# Patient Record
Sex: Female | Born: 1975 | Race: Black or African American | Hispanic: No | Marital: Single | Smoking: Never smoker
Health system: Southern US, Community
[De-identification: ages and names within clinical notes are randomized; demographics above are authoritative.]

## PROBLEM LIST (undated history)

## (undated) HISTORY — PX: LAPAROSCOPIC GASTRIC SLEEVE RESECTION: SHX5895

## (undated) HISTORY — PX: ABDOMINAL SURGERY: SHX537

## (undated) HISTORY — PX: OTHER SURGICAL HISTORY: SHX169

---

## 2009-12-03 ENCOUNTER — Emergency Department (HOSPITAL_BASED_OUTPATIENT_CLINIC_OR_DEPARTMENT_OTHER): Admission: EM | Admit: 2009-12-03 | Discharge: 2009-12-03 | Payer: Self-pay | Admitting: Emergency Medicine

## 2020-08-27 ENCOUNTER — Other Ambulatory Visit: Payer: Self-pay

## 2020-08-27 ENCOUNTER — Emergency Department (HOSPITAL_BASED_OUTPATIENT_CLINIC_OR_DEPARTMENT_OTHER)
Admission: EM | Admit: 2020-08-27 | Discharge: 2020-08-27 | Disposition: A | Discharge status: Home / Self Care | Payer: Medicaid Other | Attending: Emergency Medicine | Admitting: Emergency Medicine

## 2020-08-27 ENCOUNTER — Encounter (HOSPITAL_BASED_OUTPATIENT_CLINIC_OR_DEPARTMENT_OTHER): Payer: Self-pay | Admitting: Emergency Medicine

## 2020-08-27 DIAGNOSIS — U071 COVID-19: Secondary | ICD-10-CM | POA: Diagnosis not present

## 2020-08-27 DIAGNOSIS — R42 Dizziness and giddiness: Secondary | ICD-10-CM | POA: Diagnosis present

## 2020-08-27 LAB — SARS CORONAVIRUS 2 (TAT 6-24 HRS): SARS Coronavirus 2: POSITIVE — AB

## 2020-08-27 MED ORDER — MECLIZINE HCL 25 MG PO TABS: 25.0000 mg | ORAL_TABLET | Freq: Three times a day (TID) | ORAL | 0 refills | Status: DC | PRN

## 2020-08-27 NOTE — ED Provider Notes (Signed)
[facility] EMERGENCY DEPARTMENT Provider Note   CSN: [id number] Arrival date & time: 08/27/20  1601     History Chief Complaint  Patient presents with  . Dizziness    Ariel Flores is a 45 y.o. female w/ hx of vertigo presenting to Ed with vertigo. She reports she woke up feeling dizzy yesterday, room spinning.  This was worse with movement.  Better at rest.  Sx lingered all day, then this morning she woke up again feeling vertigo again.  No nausea, vomiting, headache, numbness or weakness of extremities.  No hx of TIA or stroke.  No hx of smoking or drug use.  She is not vaccinated for covid 19.  No hx of cough, congestion, fever, sore throat, or headache.  No dysuria, hematuria, or UTI symptoms.  No ear pain, loss of hearing, ear drainage, or change in hearing.  Hx of vertigo in the past, at random, lasting few days then resolving.  HPI     History reviewed. No pertinent past medical history.  There are no problems to display for this patient.   Past Surgical History:  Procedure Laterality Date  . LAPAROSCOPIC GASTRIC SLEEVE RESECTION       OB History   No obstetric history on file.     No family history on file.  Social History   Tobacco Use  . Smoking status: Never Smoker  . Smokeless tobacco: Never Used  Substance Use Topics  . Alcohol use: Yes    Comment: occasionally  . Drug use: Never    Home Medications Prior to Admission medications   Medication Sig Start Date End Date Taking? Authorizing Provider  meclizine (ANTIVERT) 25 MG tablet Take 1 tablet (25 mg total) by mouth 3 (three) times daily as needed for up to 30 doses for dizziness. 08/27/20  Yes Ricardo Schubach, Kermit Balo, MD    Allergies    Patient has no allergy information on record.  Review of Systems   Review of Systems  Constitutional: Negative for chills and fever.  HENT: Negative for ear discharge, ear pain and hearing loss.   Eyes: Negative for pain and visual disturbance.   Respiratory: Negative for cough and shortness of breath.   Cardiovascular: Negative for chest pain and palpitations.  Gastrointestinal: Negative for abdominal pain, nausea and vomiting.  Genitourinary: Negative for difficulty urinating and dysuria.  Musculoskeletal: Negative for arthralgias and myalgias.  Skin: Negative for color change and rash.  Neurological: Positive for dizziness. Negative for seizures, syncope, facial asymmetry, speech difficulty, weakness, numbness and headaches.  All other systems reviewed and are negative.   Physical Exam Updated Vital Signs BP 109/63 (BP Location: Right Arm)   Pulse 68   Temp 98.1 F (36.7 C) (Oral)   Resp 18   Ht 5\' 7"  (1.702 m)   Wt 69.9 kg   SpO2 100%   BMI 24.12 kg/m   Physical Exam Constitutional:      General: She is not in acute distress. HENT:     Head: Normocephalic and atraumatic.  Eyes:     General: No visual field deficit.    Conjunctiva/sclera: Conjunctivae normal.     Pupils: Pupils are equal, round, and reactive to light.  Cardiovascular:     Rate and Rhythm: Normal rate and regular rhythm.  Pulmonary:     Effort: Pulmonary effort is normal. No respiratory distress.  Abdominal:     General: There is no distension.     Tenderness: There is no abdominal tenderness.  Skin:    General: Skin is warm and dry.  Neurological:     General: No focal deficit present.     Mental Status: She is alert. Mental status is at baseline.     GCS: GCS eye subscore is 4. GCS verbal subscore is 5. GCS motor subscore is 6.     Cranial Nerves: Cranial nerves are intact. No cranial nerve deficit, dysarthria or facial asymmetry.     Sensory: Sensation is intact.     Motor: Motor function is intact.     Coordination: Coordination is intact.     Comments: Head impulse shows corrective saccades Nystagmus is present, unilateral with rightward gaze Test of skew - no skew   Psychiatric:        Mood and Affect: Mood normal.         Behavior: Behavior normal.     ED Results / Procedures / Treatments   Labs (all labs ordered are listed, but only abnormal results are displayed) Labs Reviewed  SARS CORONAVIRUS 2 (TAT 6-24 HRS) - Abnormal; Notable for the following components:      Result Value   SARS Coronavirus 2 POSITIVE (*)    All other components within normal limits    EKG None  Radiology No results found.  Procedures Procedures (including critical care time)  Medications Ordered in ED Medications - No data to display  ED Course  I have reviewed the triage vital signs and the nursing notes.  Pertinent labs & imaging results that were available during my care of the patient were reviewed by me and considered in my medical decision making (see chart for details).  45 yo female here with vertigo, mild symptoms HINTS exam suggestive of peripheral cause of vertigo No acute stroke risk factors  PCP regularly checks her bloodwork.  Careeverywhere shows blood tests in Aug 2021, normal BMP and Mg and b12 level.  Normal hgb in 09/13/19 - no hx of significant anemia.   Doubt stroke, anemia, ACS at this time   We discussed covid testing as well, will send one out Meclizine prescribed for vertigo sx at home  Okay to discharge  Final Clinical Impression(s) / ED Diagnoses Final diagnoses:  Vertigo    Rx / DC Orders ED Discharge Orders         Ordered    meclizine (ANTIVERT) 25 MG tablet  3 times daily PRN        08/27/20 0820           Terald Sleeper, MD 08/27/20 1707

## 2020-08-27 NOTE — ED Triage Notes (Signed)
Reports waking up dizzy the last two morning.  Denies any other complaints.

## 2021-01-26 ENCOUNTER — Other Ambulatory Visit: Payer: Self-pay

## 2021-01-26 ENCOUNTER — Encounter (HOSPITAL_BASED_OUTPATIENT_CLINIC_OR_DEPARTMENT_OTHER): Payer: Self-pay | Admitting: Urology

## 2021-01-26 ENCOUNTER — Emergency Department (HOSPITAL_BASED_OUTPATIENT_CLINIC_OR_DEPARTMENT_OTHER)
Admission: EM | Admit: 2021-01-26 | Discharge: 2021-01-26 | Disposition: A | Discharge status: Home / Self Care | Payer: Medicaid Other | Attending: Emergency Medicine | Admitting: Emergency Medicine

## 2021-01-26 DIAGNOSIS — R110 Nausea: Secondary | ICD-10-CM | POA: Insufficient documentation

## 2021-01-26 DIAGNOSIS — R1013 Epigastric pain: Secondary | ICD-10-CM | POA: Diagnosis not present

## 2021-01-26 LAB — CBC WITH DIFFERENTIAL/PLATELET
Abs Immature Granulocytes: 0.02 10*3/uL (ref 0.00–0.07)
Basophils Absolute: 0 10*3/uL (ref 0.0–0.1)
Basophils Relative: 1 %
Eosinophils Absolute: 0.2 10*3/uL (ref 0.0–0.5)
Eosinophils Relative: 3 %
HCT: 35.8 % — ABNORMAL LOW (ref 36.0–46.0)
Hemoglobin: 12.4 g/dL (ref 12.0–15.0)
Immature Granulocytes: 0 %
Lymphocytes Relative: 13 %
Lymphs Abs: 0.7 10*3/uL (ref 0.7–4.0)
MCH: 31.7 pg (ref 26.0–34.0)
MCHC: 34.6 g/dL (ref 30.0–36.0)
MCV: 91.6 fL (ref 80.0–100.0)
Monocytes Absolute: 0.4 10*3/uL (ref 0.1–1.0)
Monocytes Relative: 7 %
Neutro Abs: 4.2 10*3/uL (ref 1.7–7.7)
Neutrophils Relative %: 76 %
Platelets: 150 10*3/uL (ref 150–400)
RBC: 3.91 MIL/uL (ref 3.87–5.11)
RDW: 12.4 % (ref 11.5–15.5)
WBC: 5.5 10*3/uL (ref 4.0–10.5)
nRBC: 0 % (ref 0.0–0.2)

## 2021-01-26 LAB — COMPREHENSIVE METABOLIC PANEL
ALT: 19 U/L (ref 0–44)
AST: 25 U/L (ref 15–41)
Albumin: 3.7 g/dL (ref 3.5–5.0)
Alkaline Phosphatase: 55 U/L (ref 38–126)
Anion gap: 6 (ref 5–15)
BUN: 11 mg/dL (ref 6–20)
CO2: 27 mmol/L (ref 22–32)
Calcium: 9.2 mg/dL (ref 8.9–10.3)
Chloride: 107 mmol/L (ref 98–111)
Creatinine, Ser: 0.83 mg/dL (ref 0.44–1.00)
GFR, Estimated: >60 mL/min (ref >60)
Glucose, Bld: 98 mg/dL (ref 70–99)
Potassium: 3.8 mmol/L (ref 3.5–5.1)
Sodium: 140 mmol/L (ref 135–145)
Total Bilirubin: 0.4 mg/dL (ref 0.3–1.2)
Total Protein: 6.3 g/dL — ABNORMAL LOW (ref 6.5–8.1)

## 2021-01-26 LAB — URINALYSIS, MICROSCOPIC (REFLEX)

## 2021-01-26 LAB — URINALYSIS, ROUTINE W REFLEX MICROSCOPIC
Bilirubin Urine: NEGATIVE
Glucose, UA: NEGATIVE mg/dL
Ketones, ur: NEGATIVE mg/dL
Leukocytes,Ua: NEGATIVE
Nitrite: NEGATIVE
Protein, ur: NEGATIVE mg/dL
Specific Gravity, Urine: 1.02 (ref 1.005–1.030)
pH: 5.5 (ref 5.0–8.0)

## 2021-01-26 LAB — PREGNANCY, URINE: Preg Test, Ur: NEGATIVE

## 2021-01-26 LAB — LIPASE, BLOOD: Lipase: 37 U/L (ref 11–51)

## 2021-01-26 MED ORDER — DICYCLOMINE HCL 10 MG PO CAPS
10.0000 mg | ORAL_CAPSULE | Freq: Once | ORAL | Status: AC
  Administered 2021-01-26: 10 mg via ORAL
  Filled 2021-01-26: qty 1

## 2021-01-26 MED ORDER — ONDANSETRON HCL 4 MG/2ML IJ SOLN
4.0000 mg | INTRAMUSCULAR | Status: AC
  Administered 2021-01-26: 4 mg via INTRAVENOUS
  Filled 2021-01-26: qty 2

## 2021-01-26 MED ORDER — ALUM & MAG HYDROXIDE-SIMETH 200-200-20 MG/5ML PO SUSP
15.0000 mL | Freq: Once | ORAL | Status: AC
  Administered 2021-01-26: 15 mL via ORAL
  Filled 2021-01-26: qty 30

## 2021-01-26 NOTE — ED Triage Notes (Signed)
Generalized abdominal pain that started last night, states nausea but denies any vomiting or diarrha

## 2021-01-26 NOTE — ED Notes (Signed)
Pt tolerating po challenge.

## 2021-01-26 NOTE — ED Notes (Signed)
ED Provider at bedside. 

## 2021-01-26 NOTE — ED Provider Notes (Signed)
[facility] EMERGENCY DEPARTMENT Provider Note   CSN: [id number] Arrival date & time: 01/26/21  4401     History Chief Complaint  Patient presents with   Abdominal Pain    Aneli Zara is a 45 y.o. female.  45 year old female with history of gastric sleeve surgery who presents with abdominal pain.  Yesterday evening, patient ate shrimp for dinner and afterwards began having epigastric pain that has been relatively constant yesterday evening and today.  She has had mild nausea but no vomiting.  She had a bowel movement earlier today, slightly loose but no severe diarrhea and no constipation problems.  She is careful about her diet since gastric sleeve surgery and denies any NSAID or alcohol use.  No fevers, urinary symptoms, or vaginal discharge.  No medications prior to arrival.  The history is provided by the patient.  Abdominal Pain     History reviewed. No pertinent past medical history.  There are no problems to display for this patient.   Past Surgical History:  Procedure Laterality Date   LAPAROSCOPIC GASTRIC SLEEVE RESECTION       OB History   No obstetric history on file.     History reviewed. No pertinent family history.  Social History   Tobacco Use   Smoking status: Never   Smokeless tobacco: Never  Substance Use Topics   Alcohol use: Not Currently    Comment: occasionally   Drug use: Never    Home Medications Prior to Admission medications   Medication Sig Start Date End Date Taking? Authorizing Provider  meclizine (ANTIVERT) 25 MG tablet Take 1 tablet (25 mg total) by mouth 3 (three) times daily as needed for up to 30 doses for dizziness. 08/27/20   Terald Sleeper, MD    Allergies    Patient has no allergy information on record.  Review of Systems   Review of Systems  Gastrointestinal:  Positive for abdominal pain.  All other systems reviewed and are negative except that which was mentioned in HPI  Physical Exam Updated Vital  Signs BP 106/69   Pulse (!) 51   Temp 97.8 F (36.6 C) (Oral)   Resp 18   Ht 5\' 7"  (1.702 m)   Wt 73.9 kg   SpO2 100%   BMI 25.53 kg/m   Physical Exam Vitals and nursing note reviewed.  Constitutional:      General: She is not in acute distress.    Appearance: Normal appearance.  HENT:     Head: Normocephalic and atraumatic.  Eyes:     Conjunctiva/sclera: Conjunctivae normal.  Cardiovascular:     Rate and Rhythm: Normal rate and regular rhythm.     Heart sounds: Normal heart sounds. No murmur heard. Pulmonary:     Effort: Pulmonary effort is normal.     Breath sounds: Normal breath sounds.  Abdominal:     General: Abdomen is flat. Bowel sounds are normal. There is no distension.     Palpations: Abdomen is soft.     Tenderness: There is abdominal tenderness in the epigastric area. There is no guarding or rebound.  Musculoskeletal:     Right lower leg: No edema.     Left lower leg: No edema.  Skin:    General: Skin is warm and dry.  Neurological:     Mental Status: She is alert and oriented to person, place, and time.     Comments: fluent  Psychiatric:        Mood and Affect: Mood  normal.        Behavior: Behavior normal.    ED Results / Procedures / Treatments   Labs (all labs ordered are listed, but only abnormal results are displayed) Labs Reviewed  COMPREHENSIVE METABOLIC PANEL - Abnormal; Notable for the following components:      Result Value   Total Protein 6.3 (*)    All other components within normal limits  CBC WITH DIFFERENTIAL/PLATELET - Abnormal; Notable for the following components:   HCT 35.8 (*)    All other components within normal limits  URINALYSIS, ROUTINE W REFLEX MICROSCOPIC - Abnormal; Notable for the following components:   Hgb urine dipstick TRACE (*)    All other components within normal limits  URINALYSIS, MICROSCOPIC (REFLEX) - Abnormal; Notable for the following components:   Bacteria, UA RARE (*)    All other components within  normal limits  LIPASE, BLOOD  PREGNANCY, URINE    EKG None  Radiology No results found.  Procedures Procedures   Medications Ordered in ED Medications  dicyclomine (BENTYL) capsule 10 mg (10 mg Oral Given 01/26/21 0924)  ondansetron (ZOFRAN) injection 4 mg (4 mg Intravenous Given 01/26/21 0933)  alum & mag hydroxide-simeth (MAALOX/MYLANTA) 200-200-20 MG/5ML suspension 15 mL (15 mLs Oral Given 01/26/21 1107)    ED Course  I have reviewed the triage vital signs and the nursing notes.  Pertinent labs  that were available during my care of the patient were reviewed by me and considered in my medical decision making (see chart for details).    MDM Rules/Calculators/A&P                          Comfortable on exam, normal vital signs, mild epigastric tenderness but no right upper quadrant tenderness and no lower abdominal pain whatsoever.  Her lab work here is reassuring with normal LFTs and lipase, normal CMP, normal UA.  After receiving above medications, patient tolerating graham crackers and apple juice on reassessment.  States she feels better.  I have discussed supportive measures at home and extensively reviewed return precautions including worsening pain, vomiting, or fever.  Patient voiced understanding. Final Clinical Impression(s) / ED Diagnoses Final diagnoses:  Epigastric pain    Rx / DC Orders ED Discharge Orders     None        Aaliah Jorgenson, Ambrose Finland, MD 01/26/21 1149

## 2021-06-21 ENCOUNTER — Emergency Department (HOSPITAL_BASED_OUTPATIENT_CLINIC_OR_DEPARTMENT_OTHER)
Admission: EM | Admit: 2021-06-21 | Discharge: 2021-06-21 | Disposition: A | Discharge status: Home / Self Care | Payer: Medicaid Other | Attending: Emergency Medicine | Admitting: Emergency Medicine

## 2021-06-21 ENCOUNTER — Encounter (HOSPITAL_BASED_OUTPATIENT_CLINIC_OR_DEPARTMENT_OTHER): Payer: Self-pay | Admitting: *Deleted

## 2021-06-21 ENCOUNTER — Other Ambulatory Visit: Payer: Self-pay

## 2021-06-21 DIAGNOSIS — H9202 Otalgia, left ear: Secondary | ICD-10-CM | POA: Insufficient documentation

## 2021-06-21 NOTE — ED Provider Notes (Signed)
[facility] EMERGENCY DEPARTMENT Provider Note   CSN: [id number] Arrival date & time: 06/21/21  1413     History Chief Complaint  Patient presents with   Otalgia    Ariel Flores is a 45 y.o. female who presents the emergency department with left ear pain x2 days.  She states that symptoms had come on gradually, and the pain feels like a throbbing.  She states that at one point the pain went away with Tylenol, but returned the following day.  She said no changes in her hearing.  No fevers, chills, pain in her jaw, dental pain, sore throat, nasal congestion or cough.   Otalgia Associated symptoms: no congestion, no cough, no ear discharge, no fever, no hearing loss, no sore throat and no tinnitus       History reviewed. No pertinent past medical history.  There are no problems to display for this patient.   Past Surgical History:  Procedure Laterality Date   CESAREAN SECTION     LAPAROSCOPIC GASTRIC SLEEVE RESECTION       OB History   No obstetric history on file.     No family history on file.  Social History   Tobacco Use   Smoking status: Never   Smokeless tobacco: Never  Substance Use Topics   Alcohol use: Not Currently    Comment: occasionally   Drug use: Never    Home Medications Prior to Admission medications   Medication Sig Start Date End Date Taking? Authorizing Provider  meclizine (ANTIVERT) 25 MG tablet Take 1 tablet (25 mg total) by mouth 3 (three) times daily as needed for up to 30 doses for dizziness. 08/27/20   Terald Sleeper, MD    Allergies    Patient has no known allergies.  Review of Systems   Review of Systems  Constitutional:  Negative for chills and fever.  HENT:  Positive for ear pain. Negative for congestion, ear discharge, hearing loss, sore throat, tinnitus and trouble swallowing.   Eyes:  Negative for pain and visual disturbance.  Respiratory:  Negative for cough and shortness of breath.   Cardiovascular:   Negative for chest pain.  All other systems reviewed and are negative.  Physical Exam Updated Vital Signs BP (!) 121/57   Pulse 69   Temp 98.1 F (36.7 C) (Oral)   Resp 16   Ht 5\' 7"  (1.702 m)   Wt 81.2 kg   SpO2 100%   BMI 28.04 kg/m   Physical Exam Vitals and nursing note reviewed.  Constitutional:      Appearance: Normal appearance.  HENT:     Head: Normocephalic and atraumatic.     Comments: No tenderness to palpation over the temporal arteries.    Right Ear: Tympanic membrane, ear canal and external ear normal.     Left Ear: Tympanic membrane normal.     Ears:     Comments: There is some tenderness to palpation over the left tragus, with no obvious lesions or erythema.  No mastoid tenderness.  Mild ear canal edema, with no obvious signs of otitis externa. Eyes:     Conjunctiva/sclera: Conjunctivae normal.  Pulmonary:     Effort: Pulmonary effort is normal. No respiratory distress.  Skin:    General: Skin is warm and dry.  Neurological:     Mental Status: She is alert.  Psychiatric:        Mood and Affect: Mood normal.        Behavior: Behavior normal.  ED Results / Procedures / Treatments   Labs (all labs ordered are listed, but only abnormal results are displayed) Labs Reviewed - No data to display  EKG None  Radiology No results found.  Procedures Procedures   Medications Ordered in ED Medications - No data to display  ED Course  I have reviewed the triage vital signs and the nursing notes.  Pertinent labs & imaging results that were available during my care of the patient were reviewed by me and considered in my medical decision making (see chart for details).    MDM Rules/Calculators/A&P                           Patient is otherwise healthy 45 year old female who presents to the emergency department for 2 days of left ear pain.  Patient states the pain feels like throbbing, but there is no associated hearing loss.  On exam there is mild  tenderness to palpation over the tragus of the left ear, no pain with manipulation of the rest of the outer ear.  No mastoid tenderness.  There is some mild edema of the left ear canal, but there is no evidence of acute otitis media or otitis externa.  There is no jaw pain to palpation, no trismus, no nasal congestion.  She has had no fevers, or chills.  No tenderness to palpation over the temporal arteries, or any visual changes.  Unlikely that her pain is referred from either the jaw, the teeth, the sinuses, or the temporal arteries.  At this time I do not see evidence of any acute pathology of her ear pain requiring further work-up or treatment at this time.  Explained that she can be taking over-the-counter medications for pain, and is provided the contact information for ENT.  Otherwise patient is clinically well-appearing, not requiring admission or inpatient treatment for her symptoms at this time.  She is stable for discharge, discussed reasons to return to the emergency department.  Patient agreeable to plan.  Final Clinical Impression(s) / ED Diagnoses Final diagnoses:  Left ear pain    Rx / DC Orders ED Discharge Orders     None        Estill Cotta 06/21/21 [place], [place], MD 07/03/21 [phone]

## 2021-06-21 NOTE — ED Triage Notes (Signed)
C/o left ear pain x 2 days.

## 2021-06-21 NOTE — Discharge Instructions (Addendum)
You were seen in the emergency department today for ear pain.  As we discussed your ears do not look acutely infected, but there was a little bit of inflammation in the ear canal of your left ear.  I do not think antibiotics would be helpful today, but I would like you to continue taking Tylenol for the next couple days for the pain as well as the inflammation.  I am also attaching all the contact information for our ear, nose, and throat specialist.  If your symptoms persist through the end of the week, like you to call them and try to make an appointment.  Sometimes is helpful if you state that you have already been to the ER for similar symptoms.  Otherwise I think it is important to try to establish care with a new PCP.  Continue to monitor how you're doing and return to the ER for new or worsening symptoms such as fevers, chills, drainage from your ears.   It has been a pleasure seeing and caring for you today and I hope you start feeling better soon!

## 2021-12-05 ENCOUNTER — Emergency Department (HOSPITAL_BASED_OUTPATIENT_CLINIC_OR_DEPARTMENT_OTHER): Payer: Medicaid Other

## 2021-12-05 ENCOUNTER — Encounter (HOSPITAL_BASED_OUTPATIENT_CLINIC_OR_DEPARTMENT_OTHER): Payer: Self-pay | Admitting: Emergency Medicine

## 2021-12-05 ENCOUNTER — Emergency Department (HOSPITAL_BASED_OUTPATIENT_CLINIC_OR_DEPARTMENT_OTHER)
Admission: EM | Admit: 2021-12-05 | Discharge: 2021-12-05 | Disposition: A | Discharge status: Home / Self Care | Payer: Medicaid Other | Attending: Emergency Medicine | Admitting: Emergency Medicine

## 2021-12-05 ENCOUNTER — Other Ambulatory Visit: Payer: Self-pay

## 2021-12-05 DIAGNOSIS — M545 Low back pain, unspecified: Secondary | ICD-10-CM | POA: Insufficient documentation

## 2021-12-05 MED ORDER — LIDOCAINE 5 % EX PTCH
1.0000 | MEDICATED_PATCH | CUTANEOUS | Status: DC
  Administered 2021-12-05: 1 via TRANSDERMAL
  Filled 2021-12-05: qty 1

## 2021-12-05 MED ORDER — METHOCARBAMOL 500 MG PO TABS: 500.0000 mg | ORAL_TABLET | Freq: Two times a day (BID) | ORAL | 0 refills | Status: DC

## 2021-12-05 MED ORDER — LIDOCAINE 5 % EX PTCH: 1.0000 | MEDICATED_PATCH | CUTANEOUS | 0 refills | Status: DC

## 2021-12-05 NOTE — ED Notes (Signed)
Pt transported to CT ?

## 2021-12-05 NOTE — Discharge Instructions (Signed)
Use the Lidoderm patches every 12 hours for back pain. ?You can take the Robaxin in the evenings before bed, do not take it during the day as it will make you drowsy.  Continue taking the ibuprofen and Tylenol, follow-up with your primary to continue to have back pain.  I think a lot of it has to do with sleeping in the recliner if you are able to try transitioning back into the bed. ?

## 2021-12-05 NOTE — ED Provider Notes (Signed)
?[facility] EMERGENCY DEPARTMENT ?Provider Note ? ? ?CSN: [id number] ?Arrival date & time: 12/05/21  1611 ? ?  ? ?History ? ?Chief Complaint  ?Patient presents with  ? Back Pain  ? ? ?Ariel Flores is a 46 y.o. female. ? ? ?Back Pain ? ?Patient with medical history notable for laparoscopic gastric sleeve resection, panniculectomy 11/10/21, presents today with low back pain.  She states it started after having the panniculectomy performed, has been progressively worsening but became "unbearable" 2 days ago.  States she has been standing different position since the surgery, she is having to sleep in a recliner which is helping.  The back pain is constant but worse with any ambulation or movement.  Does not radiate down her legs, no bilateral lower extremity weakness, no saddle anesthesia, no previous back surgeries or history of malignancy, no urinary retention or fecal incontinence. ? ?Home Medications ?Prior to Admission medications   ?Medication Sig Start Date End Date Taking? Authorizing Provider  ?lidocaine (LIDODERM) 5 % Place 1 patch onto the skin daily. Remove & Discard patch within 12 hours or as directed by MD 12/05/21  Yes Theron Arista, PA-C  ?methocarbamol (ROBAXIN) 500 MG tablet Take 1 tablet (500 mg total) by mouth 2 (two) times daily. 12/05/21  Yes Theron Arista, PA-C  ?meclizine (ANTIVERT) 25 MG tablet Take 1 tablet (25 mg total) by mouth 3 (three) times daily as needed for up to 30 doses for dizziness. 08/27/20   Terald Sleeper, MD  ?   ? ?Allergies    ?Patient has no known allergies.   ? ?Review of Systems   ?Review of Systems  ?Musculoskeletal:  Positive for back pain.  ? ?Physical Exam ?Updated Vital Signs ?BP 131/70   Pulse 72   Temp 98.4 ?F (36.9 ?C) (Oral)   Resp 18   Ht 5\' 7"  (1.702 m)   Wt 87.5 kg   SpO2 100%   BMI 30.23 kg/m?  ?Physical Exam ?Vitals and nursing note reviewed. Exam conducted with a chaperone present.  ?Constitutional:   ?   Appearance: Normal appearance.  ?HENT:   ?   Head: Normocephalic and atraumatic.  ?Eyes:  ?   General: No scleral icterus.    ?   Right eye: No discharge.     ?   Left eye: No discharge.  ?   Extraocular Movements: Extraocular movements intact.  ?   Pupils: Pupils are equal, round, and reactive to light.  ?Cardiovascular:  ?   Rate and Rhythm: Normal rate and regular rhythm.  ?   Pulses: Normal pulses.  ?   Heart sounds: Normal heart sounds. No murmur heard. ?  No friction rub. No gallop.  ?Pulmonary:  ?   Effort: Pulmonary effort is normal. No respiratory distress.  ?   Breath sounds: Normal breath sounds.  ?Abdominal:  ?   General: Abdomen is flat. Bowel sounds are normal. There is no distension.  ?   Palpations: Abdomen is soft.  ?   Tenderness: There is no abdominal tenderness.  ?   Comments: Abdominal scars present and well healing, no abdominal distention or tenderness  ?Musculoskeletal:     ?   General: Tenderness present.  ?   Comments: Tenderness at L5, some paraspinal tenderness.  Able to move upper and lower extremities  ?Skin: ?   General: Skin is warm and dry.  ?   Coloration: Skin is not jaundiced.  ?Neurological:  ?   Mental Status: She is alert.  Mental status is at baseline.  ?   Coordination: Coordination normal.  ?   Comments: GCS 15.  Cranial nerves III through XII are grossly intact, grip strength is equal bilaterally.  Dorsiflexion and plantarflexion 5/5 against resistance.  ? ? ?ED Results / Procedures / Treatments   ?Labs ?(all labs ordered are listed, but only abnormal results are displayed) ?Labs Reviewed - No data to display ? ?EKG ?None ? ?Radiology ?CT Lumbar Spine Wo Contrast ? ?Result Date: 12/05/2021 ?CLINICAL DATA:  Low back pain EXAM: CT LUMBAR SPINE WITHOUT CONTRAST TECHNIQUE: Multidetector CT imaging of the lumbar spine was performed without intravenous contrast administration. Multiplanar CT image reconstructions were also generated. RADIATION DOSE REDUCTION: This exam was performed according to the departmental  dose-optimization program which includes automated exposure control, adjustment of the mA and/or kV according to patient size and/or use of iterative reconstruction technique. COMPARISON:  None. FINDINGS: Segmentation: 5 lumbar type vertebrae. Alignment: Normal. Vertebrae: No acute fracture or focal pathologic process. Paraspinal and other soft tissues: Negative. Hyperdense sludge and small stones in the gallbladder. Disc levels: At L1-L2, maintained disc space. No canal stenosis. The foramen are patent bilaterally. At L2-L3, maintained disc space. No canal stenosis. Mild facet degenerative changes. Foramen are patent bilaterally. At L3-L4, maintained disc space. Mild diffuse disc bulge. No canal stenosis. Moderate facet degenerative changes. Mild bilateral foraminal narrowing. At L4-L5, maintained disc space. Mild diffuse disc bulge. Moderate hypertrophic facet degenerative changes. Ligamentum flavum thickening. Mild canal stenosis. At L5-S1, marked disc space narrowing. Mild diffuse disc bulge. Mild canal stenosis. Hypertrophic facet degenerative changes. Moderate right greater than left foraminal narrowing. IMPRESSION: 1. No acute osseous abnormality. 2. Multilevel degenerative changes, most significant at L5-S1 where there is bilateral foraminal encroachment by disc osteophyte. No high-grade canal stenosis. Electronically Signed   By: Jasmine Pang M.D.   On: 12/05/2021 18:14   ? ?Procedures ?Procedures  ? ? ?Medications Ordered in ED ?Medications  ?lidocaine (LIDODERM) 5 % 1 patch (1 patch Transdermal Patch Applied 12/05/21 1730)  ? ? ?ED Course/ Medical Decision Making/ A&P ?  ?                        ?Medical Decision Making ?Amount and/or Complexity of Data Reviewed ?Radiology: ordered. ? ?Risk ?Prescription drug management. ? ? ?This is a 46 year old presenting today with low back pain.  Her history is complicated by recent panniculectomy resulting in new muscle usage and abnormal sleeping patterns.  Family  members present at bedside providing an independent history. ? ?On exam there are no focal deficits, she is ambulatory with a slower gait.  Neurovascularly intact with DP and PT 2+, there was point tenderness over the lumbar spine so CT scan was ordered to better evaluate.  I viewed the CT scan I ordered and it shows some disc degeneration but no obvious acute process. ? ?Although I did consider cauda equina, epidural abscess, malignancy I think all these are less likely.  I think her back pain is most likely secondary to muscle strain, I ordered her Lidoderm which helped her symptoms here in the ED.  Discharged home with Lidoderm patches and Robaxin.  Encourage close follow-up with primary. ? ? ? ? ? ? ? ?Final Clinical Impression(s) / ED Diagnoses ?Final diagnoses:  ?Midline low back pain without sciatica, unspecified chronicity  ? ? ?Rx / DC Orders ?ED Discharge Orders   ? ?      Ordered  ?  lidocaine (LIDODERM) 5 %  Every 24 hours       ? 12/05/21 1837  ?  methocarbamol (ROBAXIN) 500 MG tablet  2 times daily       ? 12/05/21 1837  ? ?  ?  ? ?  ? ? ?  ?Theron AristaSage, Sherle Mello, PA-C ?12/05/21 1840 ? ?  ?Alvira MondaySchlossman, Erin, MD ?12/07/21 0002 ? ?

## 2021-12-05 NOTE — ED Notes (Signed)
Gave pt urine specimen cup. Pt reports she is unable to urinate at this time. ?

## 2021-12-05 NOTE — ED Triage Notes (Signed)
Pt arrives pov, slow gait to triage, c/o lower back pain, difficulty ambulating x 2 days. Denies injury. 1 month Post abdominal surgery, denies incision issues ?

## 2022-02-24 ENCOUNTER — Encounter (HOSPITAL_BASED_OUTPATIENT_CLINIC_OR_DEPARTMENT_OTHER): Payer: Self-pay | Admitting: Emergency Medicine

## 2022-02-24 ENCOUNTER — Emergency Department (HOSPITAL_BASED_OUTPATIENT_CLINIC_OR_DEPARTMENT_OTHER): Payer: Medicaid Other

## 2022-02-24 ENCOUNTER — Other Ambulatory Visit: Payer: Self-pay

## 2022-02-24 ENCOUNTER — Emergency Department (HOSPITAL_BASED_OUTPATIENT_CLINIC_OR_DEPARTMENT_OTHER)
Admission: EM | Admit: 2022-02-24 | Discharge: 2022-02-24 | Disposition: A | Discharge status: Home / Self Care | Payer: Medicaid Other | Attending: Emergency Medicine | Admitting: Emergency Medicine

## 2022-02-24 DIAGNOSIS — M25562 Pain in left knee: Secondary | ICD-10-CM | POA: Diagnosis present

## 2022-02-24 MED ORDER — DICLOFENAC SODIUM 1 % EX GEL: 2.0000 g | Freq: Four times a day (QID) | CUTANEOUS | 0 refills | Status: DC | PRN

## 2022-02-24 NOTE — ED Triage Notes (Signed)
Pt c/o left knee pain x 2 days without known injury

## 2022-02-24 NOTE — ED Provider Notes (Signed)
[facility] EMERGENCY DEPARTMENT Provider Note   CSN: [id number] Arrival date & time: 02/24/22  0500     History  Chief Complaint  Patient presents with   Knee Pain    Ariel Flores is a 46 y.o. female.  The history is provided by the patient.  Knee Pain Ariel Flores is a 46 y.o. female who presents to the Emergency Department complaining of left knee pain that started yesterday.  No reports of injury or trauma.  Pain is described as a toothache type sensation.  It [place] she was at work.  She works as a Water engineer.  She soaked it in hot water with partial improvement in her symptoms.  Today during her shift her pain worsened.  She can walk but does have pain on weightbearing.  No fever or systemic symptoms.  She did use lidocaine spray at home with partial improvement in her symptoms.  She has a history of gastric surgery and cannot take NSAIDs.      Home Medications Prior to Admission medications   Medication Sig Start Date End Date Taking? Authorizing Provider  diclofenac Sodium (VOLTAREN ARTHRITIS PAIN) 1 % GEL Apply 2 g topically 4 (four) times daily as needed. 02/24/22  Yes Tilden Fossa, MD  lidocaine (LIDODERM) 5 % Place 1 patch onto the skin daily. Remove & Discard patch within 12 hours or as directed by MD 12/05/21   Theron Arista, PA-C  meclizine (ANTIVERT) 25 MG tablet Take 1 tablet (25 mg total) by mouth 3 (three) times daily as needed for up to 30 doses for dizziness. 08/27/20   Terald Sleeper, MD  methocarbamol (ROBAXIN) 500 MG tablet Take 1 tablet (500 mg total) by mouth 2 (two) times daily. 12/05/21   Theron Arista, PA-C      Allergies    Patient has no known allergies.    Review of Systems   Review of Systems  All other systems reviewed and are negative.   Physical Exam Updated Vital Signs BP (!) 109/58   Pulse 64   Temp 98.5 F (36.9 C) (Oral)   Resp 16   Ht 5\' 7"  (1.702 m)   Wt 81.6 kg   SpO2 100%   BMI 28.19 kg/m  Physical  Exam Vitals and nursing note reviewed.  Constitutional:      Appearance: She is well-developed.  HENT:     Head: Normocephalic and atraumatic.  Cardiovascular:     Rate and Rhythm: Normal rate and regular rhythm.  Pulmonary:     Effort: Pulmonary effort is normal. No respiratory distress.  Musculoskeletal:     Comments: 2+ DP pulses bilaterally.  There is no significant soft tissue swelling to bilateral knees.  There is mild tenderness to palpation over the left medial joint line with range of motion intact.  She does have antalgic gait.  No overlying erythema or wounds.  Flexion extension is intact at the knee.  Skin:    General: Skin is warm and dry.  Neurological:     Mental Status: She is alert and oriented to person, place, and time.  Psychiatric:        Behavior: Behavior normal.     ED Results / Procedures / Treatments   Labs (all labs ordered are listed, but only abnormal results are displayed) Labs Reviewed - No data to display  EKG None  Radiology DG Knee Complete 4 Views Left  Result Date: 02/24/2022 CLINICAL DATA:  46 year old female with history of left-sided knee pain.  No known injury. EXAM: LEFT KNEE - COMPLETE 4+ VIEW COMPARISON:  None Available. FINDINGS: Four views of the left knee demonstrate no acute displaced fracture, subluxation or dislocation. There is joint space narrowing, subchondral sclerosis and mild osteophyte formation in a tricompartmental distribution, most severe in the medial and patellofemoral compartments, indicative of osteoarthritis. There is also a subtle lucency in the medial aspect of the medial femoral condyle just deep to the articular surface. IMPRESSION: 1. No acute radiographic abnormality of the left knee. 2. Degenerative changes of tricompartmental osteoarthritis, most severe in the medial and patellofemoral compartments. Lucency just deep to the articular surface of the medial aspect of the medial femoral condyle. This could simply  represent a subchondral cyst in the setting of osteoarthritis, and could be further evaluated with follow-up nonemergent MRI of the left knee if clinically appropriate. Electronically Signed   By: Trudie Reed M.D.   On: 02/24/2022 05:50    Procedures Procedures    Medications Ordered in ED Medications - No data to display  ED Course/ Medical Decision Making/ A&P                           Medical Decision Making Amount and/or Complexity of Data Reviewed Radiology: ordered.   Patient status post gastric bypass here for evaluation of atraumatic left knee pain.  She is well perfused on evaluation with no evidence of soft tissue infection, septic or gouty arthritis.  Imaging is negative for acute fracture or dislocation.  Imaging does demonstrate incidental findings of degenerative changes, possible cyst.  Mages personally reviewed and interpreted as well.  Discussed with patient findings of studies.  Given her gastric surgery will only treat with topical NSAIDs with oral acetaminophen.  Discussed knee sleeve for comfort.  Recommend orthopedics follow-up-she has seen orthopedics in the past for right knee pain.  Discussed rest with activity acts as tolerated       Final Clinical Impression(s) / ED Diagnoses Final diagnoses:  Acute pain of left knee    Rx / DC Orders ED Discharge Orders          Ordered    diclofenac Sodium (VOLTAREN ARTHRITIS PAIN) 1 % GEL  4 times daily PRN        02/24/22 3149              Tilden Fossa, MD 02/24/22 [phone]

## 2022-09-09 ENCOUNTER — Emergency Department (HOSPITAL_BASED_OUTPATIENT_CLINIC_OR_DEPARTMENT_OTHER)
Admission: EM | Admit: 2022-09-09 | Discharge: 2022-09-09 | Disposition: A | Discharge status: Home / Self Care | Payer: Medicaid Other | Attending: Emergency Medicine | Admitting: Emergency Medicine

## 2022-09-09 ENCOUNTER — Other Ambulatory Visit: Payer: Self-pay

## 2022-09-09 ENCOUNTER — Encounter (HOSPITAL_BASED_OUTPATIENT_CLINIC_OR_DEPARTMENT_OTHER): Payer: Self-pay | Admitting: Emergency Medicine

## 2022-09-09 DIAGNOSIS — Y92410 Unspecified street and highway as the place of occurrence of the external cause: Secondary | ICD-10-CM | POA: Insufficient documentation

## 2022-09-09 DIAGNOSIS — M25512 Pain in left shoulder: Secondary | ICD-10-CM | POA: Diagnosis not present

## 2022-09-09 MED ORDER — CYCLOBENZAPRINE HCL 10 MG PO TABS
10.0000 mg | ORAL_TABLET | Freq: Once | ORAL | Status: AC
  Administered 2022-09-09: 10 mg via ORAL
  Filled 2022-09-09: qty 1

## 2022-09-09 MED ORDER — ACETAMINOPHEN 325 MG PO TABS
650.0000 mg | ORAL_TABLET | Freq: Once | ORAL | Status: AC
  Administered 2022-09-09: 650 mg via ORAL
  Filled 2022-09-09: qty 2

## 2022-09-09 MED ORDER — CYCLOBENZAPRINE HCL 10 MG PO TABS: 10.0000 mg | ORAL_TABLET | Freq: Two times a day (BID) | ORAL | 0 refills | Status: DC | PRN

## 2022-09-09 MED ORDER — ACETAMINOPHEN 325 MG PO TABS: 650.0000 mg | ORAL_TABLET | Freq: Four times a day (QID) | ORAL | 0 refills | Status: AC | PRN
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
## 2022-09-09 NOTE — ED Notes (Signed)
Pt discharged to home. Discharge instructions have been discussed with patient and/or family members. Pt verbally acknowledges understanding d/c instructions, and endorses comprehension to checkout at registration before leaving.  °

## 2022-09-09 NOTE — Discharge Instructions (Addendum)
You should follow-up with a sports medicine or orthopedic doctor.  It is possible you have an injury to rotator cuff muscle or another muscle around your shoulder.  Please call to schedule follow-up appointment in 1 to 2 weeks.

## 2022-11-05 ENCOUNTER — Ambulatory Visit (HOSPITAL_COMMUNITY)
Admission: EM | Admit: 2022-11-05 | Discharge: 2022-11-05 | Disposition: A | Discharge status: Home / Self Care | Payer: Medicaid Other | Attending: Emergency Medicine | Admitting: Emergency Medicine

## 2022-11-05 ENCOUNTER — Encounter (HOSPITAL_COMMUNITY): Payer: Self-pay

## 2022-11-05 DIAGNOSIS — J329 Chronic sinusitis, unspecified: Secondary | ICD-10-CM | POA: Diagnosis not present

## 2022-11-05 DIAGNOSIS — J302 Other seasonal allergic rhinitis: Secondary | ICD-10-CM

## 2022-11-05 LAB — POC INFLUENZA A AND B ANTIGEN (URGENT CARE ONLY)
INFLUENZA A ANTIGEN, POC: NEGATIVE
INFLUENZA B ANTIGEN, POC: NEGATIVE

## 2022-11-05 LAB — POCT RAPID STREP A, ED / UC: Streptococcus, Group A Screen (Direct): NEGATIVE

## 2022-11-05 MED ORDER — CETIRIZINE HCL 10 MG PO TABS: 10.0000 mg | ORAL_TABLET | Freq: Every day | ORAL | 1 refills | Status: AC

## 2022-11-05 MED ORDER — FLUTICASONE PROPIONATE 50 MCG/ACT NA SUSP: 1.0000 | Freq: Every day | NASAL | 2 refills | Status: AC

## 2022-11-05 NOTE — ED Provider Notes (Signed)
[facility]    CSN: [id number] Arrival date & time: 11/05/22  1151    HISTORY   Chief Complaint  Patient presents with   Sore Throat   Nasal Congestion   Headache   HPI Ariel Flores is a pleasant, 47 y.o. female who presents to urgent care today. Patient c/o a headache, sore throat, and nasal congestion x 4 days.   Patient states she has been taking Alka Seltzer night and day.  Patient states she took Tylenol last night. Patient states she took a daytime Alka seltzer today at 10:20 today.  Patient denies fever, body aches, chills, nausea, vomiting, diarrhea, known sick contacts.  The history is provided by the patient.   History reviewed. No pertinent past medical history. There are no problems to display for this patient.  Past Surgical History:  Procedure Laterality Date   ABDOMINAL SURGERY     CESAREAN SECTION     LAPAROSCOPIC GASTRIC SLEEVE RESECTION     skin removal surgery     OB History   No obstetric history on file.    Home Medications    Prior to Admission medications   Medication Sig Start Date End Date Taking? Authorizing Provider  acetaminophen (TYLENOL) 325 MG tablet Take 2 tablets (650 mg total) by mouth every 6 (six) hours as needed for up to 30 doses for mild pain or moderate pain. 09/09/22   Terald Sleeper, MD  cyclobenzaprine (FLEXERIL) 10 MG tablet Take 1 tablet (10 mg total) by mouth 2 (two) times daily as needed for up to 15 doses for muscle spasms. 09/09/22   Terald Sleeper, MD    Family History Family History  Problem Relation Age of Onset   Cirrhosis Mother    Cancer Father    Social History Social History   Tobacco Use   Smoking status: Never   Smokeless tobacco: Never  Vaping Use   Vaping Use: Never used  Substance Use Topics   Alcohol use: Not Currently   Drug use: Never   Allergies   Patient has no known allergies.  Review of Systems Review of Systems Pertinent findings revealed after performing a 14  point review of systems has been noted in the history of present illness.  Physical Exam Vital Signs BP 103/68 (BP Location: Right Arm)   Pulse (!) 58   Temp 98.3 F (36.8 C) (Oral)   Resp 16   SpO2 99%   No data found.  Physical Exam Vitals and nursing note reviewed.  Constitutional:      General: She is not in acute distress.    Appearance: Normal appearance. She is not ill-appearing.  HENT:     Head: Normocephalic and atraumatic.     Salivary Glands: Right salivary gland is not diffusely enlarged or tender. Left salivary gland is not diffusely enlarged or tender.     Right Ear: Ear canal and external ear normal. No drainage. No middle ear effusion. There is no impacted cerumen. Tympanic membrane is bulging. Tympanic membrane is not injected or erythematous.     Left Ear: Ear canal and external ear normal. No drainage.  No middle ear effusion. There is no impacted cerumen. Tympanic membrane is bulging. Tympanic membrane is not injected or erythematous.     Ears:     Comments: Bilateral EACs normal, both TMs bulging with clear fluid    Nose: Rhinorrhea present. No nasal deformity, septal deviation, signs of injury, nasal tenderness, mucosal edema or congestion. Rhinorrhea is clear.  Right Nostril: Occlusion present. No foreign body, epistaxis or septal hematoma.     Left Nostril: Occlusion present. No foreign body, epistaxis or septal hematoma.     Right Turbinates: Enlarged, swollen and pale.     Left Turbinates: Enlarged, swollen and pale.     Right Sinus: No maxillary sinus tenderness or frontal sinus tenderness.     Left Sinus: No maxillary sinus tenderness or frontal sinus tenderness.     Mouth/Throat:     Lips: Pink. No lesions.     Mouth: Mucous membranes are moist. No oral lesions.     Pharynx: Oropharynx is clear. Uvula midline. No posterior oropharyngeal erythema or uvula swelling.     Tonsils: No tonsillar exudate. 0 on the right. 0 on the left.     Comments:  Postnasal drip Eyes:     General: Lids are normal.        Right eye: No discharge.        Left eye: No discharge.     Extraocular Movements: Extraocular movements intact.     Conjunctiva/sclera: Conjunctivae normal.     Right eye: Right conjunctiva is not injected.     Left eye: Left conjunctiva is not injected.  Neck:     Trachea: Trachea and phonation normal.  Cardiovascular:     Rate and Rhythm: Normal rate and regular rhythm.     Pulses: Normal pulses.     Heart sounds: Normal heart sounds. No murmur heard.    No friction rub. No gallop.  Pulmonary:     Effort: Pulmonary effort is normal. No accessory muscle usage, prolonged expiration or respiratory distress.     Breath sounds: Normal breath sounds. No stridor, decreased air movement or transmitted upper airway sounds. No decreased breath sounds, wheezing, rhonchi or rales.  Chest:     Chest wall: No tenderness.  Musculoskeletal:        General: Normal range of motion.     Cervical back: Normal range of motion and neck supple. Normal range of motion.  Lymphadenopathy:     Cervical: No cervical adenopathy.  Skin:    General: Skin is warm and dry.     Findings: No erythema or rash.  Neurological:     General: No focal deficit present.     Mental Status: She is alert and oriented to person, place, and time.  Psychiatric:        Mood and Affect: Mood normal.        Behavior: Behavior normal.     Visual Acuity Right Eye Distance:   Left Eye Distance:   Bilateral Distance:    Right Eye Near:   Left Eye Near:    Bilateral Near:     UC Couse / Diagnostics / Procedures:     Radiology No results found.  Procedures Procedures (including critical care time) EKG  Pending results:  Labs Reviewed  POCT RAPID STREP A, ED / UC  POC INFLUENZA A AND B ANTIGEN (URGENT CARE ONLY)    Medications Ordered in UC: Medications - No data to display  UC Diagnoses / Final Clinical Impressions(s)   I have reviewed the triage  vital signs and the nursing notes.  Pertinent labs & imaging results that were available during my care of the patient were reviewed by me and considered in my medical decision making (see chart for details).    Final diagnoses:  Rhinosinusitis  Seasonal allergies   Patient advised physical exam findings are concerning for uncontrolled respiratory  allergies.  Patient provided with cetirizine and Flonase for symptoms.  Conservative care recommended.  Return precautions advised.  Please see discharge instructions below for further details of plan of care as provided to patient. ED Prescriptions     Medication Sig Dispense Auth. Provider   cetirizine (ZYRTEC ALLERGY) 10 MG tablet Take 1 tablet (10 mg total) by mouth at bedtime. 90 tablet Theadora Rama Scales, PA-C   fluticasone (FLONASE) 50 MCG/ACT nasal spray Place 1 spray into both nostrils daily. Begin by using 2 sprays in each nare daily for 3 to 5 days, then decrease to 1 spray in each nare daily. 15.8 mL Theadora Rama Scales, PA-C      PDMP not reviewed this encounter.  Disposition Upon Discharge:  Condition: stable for discharge home Home: take medications as prescribed; routine discharge instructions as discussed; follow up as advised.  Patient presented with an acute illness with associated systemic symptoms and significant discomfort requiring urgent management. In my opinion, this is a condition that a prudent lay person (someone who possesses an average knowledge of health and medicine) may potentially expect to result in complications if not addressed urgently such as respiratory distress, impairment of bodily function or dysfunction of bodily organs.   Routine symptom specific, illness specific and/or disease specific instructions were discussed with the patient and/or caregiver at length.   As such, the patient has been evaluated and assessed, work-up was performed and treatment was provided in alignment with urgent care  protocols and evidence based medicine.  Patient/parent/caregiver has been advised that the patient may require follow up for further testing and treatment if the symptoms continue in spite of treatment, as clinically indicated and appropriate.  If the patient was tested for COVID-19, Influenza and/or RSV, then the patient/parent/guardian was advised to isolate at home pending the results of his/her diagnostic coronavirus test and potentially longer if they're positive. I have also advised pt that if his/her COVID-19 test returns positive, it's recommended to self-isolate for at least 10 days after symptoms first appeared AND until fever-free for 24 hours without fever reducer AND other symptoms have improved or resolved. Discussed self-isolation recommendations as well as instructions for household member/close contacts as per the [facility] and  [facility], and also gave patient the COVID packet with this information.  Patient/parent/caregiver has been advised to return to the [facility] or PCP in 3-5 days if no better; to PCP or the Emergency Department if new signs and symptoms develop, or if the current signs or symptoms continue to change or worsen for further workup, evaluation and treatment as clinically indicated and appropriate  The patient will follow up with their current PCP if and as advised. If the patient does not currently have a PCP we will assist them in obtaining one.   The patient may need specialty follow up if the symptoms continue, in spite of conservative treatment and management, for further workup, evaluation, consultation and treatment as clinically indicated and appropriate.  Patient/parent/caregiver verbalized understanding and agreement of plan as discussed.  All questions were addressed during visit.  Please see discharge instructions below for further details of plan.  Discharge Instructions:   Discharge Instructions      Your symptoms and my physical exam findings are concerning for  exacerbation of your underlying allergies.     To avoid catching frequent respiratory infections, having skin reactions, dealing with eye irritation, losing sleep, missing work, etc., due to uncontrolled allergies, it is important that you begin/continue your allergy regimen and  are consistent with taking your meds exactly as prescribed.   Please read below to learn more about the medications, dosages and frequencies that I recommend to help alleviate your symptoms and to get you feeling better soon:  Zyrtec (cetirizine): This is an excellent second-generation antihistamine that helps to reduce respiratory inflammatory response to environmental allergens.  In some patients, this medication can cause daytime sleepiness so I recommend that you take 1 tablet daily at bedtime.     Flonase (fluticasone): This is a steroid nasal spray that used once daily, 1 spray in each nare.  This works best when used on a daily basis. This medication does not work well if it is only used when you think you need it.  After 3 to 5 days of use, you will notice significant reduction of the inflammation and mucus production that is currently being caused by exposure to allergens, whether seasonal or environmental.  The most common side effect of this medication is nosebleeds.  If you experience a nosebleed, please discontinue use for 1 week, then feel free to resume.  If you find that your insurance will not pay for this medication, please consider a different nasal steroids such as Nasonex (mometasone), or Nasacort (triamcinolone).   Advil, Motrin (ibuprofen): This is a good anti-inflammatory medication which not only addresses aches, pains but also significantly reduces soft tissue inflammation of the upper airways that causes sinus and nasal congestion as well as inflammation of the lower airways which makes you feel like your breathing is constricted or your cough feel tight.  I recommend that you take 400 mg every 8 hours as  needed.      If symptoms have not meaningfully improved in the next 5 to 7 days, please return for repeat evaluation or follow-up with your regular provider.  If symptoms have worsened in the next 3 to 5 days, please return for repeat evaluation or follow-up with your regular provider.   Your strep test today is negative.  Streptococcal throat culture will be performed per our protocol.  The result of your throat culture will be posted to your MyChart once it is complete, this typically takes 3 to 5 days.  If your streptococcal throat culture is positive, you will be contacted by phone and antibiotics will prescribed for you.   Thank you for visiting urgent care today.  We appreciate the opportunity to participate in your care.       This office note has been dictated using Teaching laboratory technician.  Unfortunately, this method of dictation can sometimes lead to typographical or grammatical errors.  I apologize for your inconvenience in advance if this occurs.  Please do not hesitate to reach out to me if clarification is needed.      Theadora Rama Scales, [place] 11/08/22 [phone]

## 2022-11-05 NOTE — Discharge Instructions (Addendum)
Your symptoms and my physical exam findings are concerning for exacerbation of your underlying allergies.     To avoid catching frequent respiratory infections, having skin reactions, dealing with eye irritation, losing sleep, missing work, etc., due to uncontrolled allergies, it is important that you begin/continue your allergy regimen and are consistent with taking your meds exactly as prescribed.   Please read below to learn more about the medications, dosages and frequencies that I recommend to help alleviate your symptoms and to get you feeling better soon:  Zyrtec (cetirizine): This is an excellent second-generation antihistamine that helps to reduce respiratory inflammatory response to environmental allergens.  In some patients, this medication can cause daytime sleepiness so I recommend that you take 1 tablet daily at bedtime.     Flonase (fluticasone): This is a steroid nasal spray that used once daily, 1 spray in each nare.  This works best when used on a daily basis. This medication does not work well if it is only used when you think you need it.  After 3 to 5 days of use, you will notice significant reduction of the inflammation and mucus production that is currently being caused by exposure to allergens, whether seasonal or environmental.  The most common side effect of this medication is nosebleeds.  If you experience a nosebleed, please discontinue use for 1 week, then feel free to resume.  If you find that your insurance will not pay for this medication, please consider a different nasal steroids such as Nasonex (mometasone), or Nasacort (triamcinolone).   Advil, Motrin (ibuprofen): This is a good anti-inflammatory medication which not only addresses aches, pains but also significantly reduces soft tissue inflammation of the upper airways that causes sinus and nasal congestion as well as inflammation of the lower airways which makes you feel like your breathing is constricted or your cough  feel tight.  I recommend that you take 400 mg every 8 hours as needed.      If symptoms have not meaningfully improved in the next 5 to 7 days, please return for repeat evaluation or follow-up with your regular provider.  If symptoms have worsened in the next 3 to 5 days, please return for repeat evaluation or follow-up with your regular provider.   Your strep test today is negative.  Streptococcal throat culture will be performed per our protocol.  The result of your throat culture will be posted to your MyChart once it is complete, this typically takes 3 to 5 days.  If your streptococcal throat culture is positive, you will be contacted by phone and antibiotics will prescribed for you.   Thank you for visiting urgent care today.  We appreciate the opportunity to participate in your care.

## 2022-11-05 NOTE — ED Triage Notes (Signed)
Patient c/o a headache, sore throat, and nasal congestion x 4 days.   Patient states she has been taking Alka Seltzer night and day. Patient states she took Tylenol last night. Patient states she took a daytime Alka seltzer today at 1020 today.

## 2022-12-10 ENCOUNTER — Emergency Department (HOSPITAL_BASED_OUTPATIENT_CLINIC_OR_DEPARTMENT_OTHER)
Admission: EM | Admit: 2022-12-10 | Discharge: 2022-12-10 | Disposition: A | Discharge status: Home / Self Care | Payer: Medicaid Other | Attending: Emergency Medicine | Admitting: Emergency Medicine

## 2022-12-10 ENCOUNTER — Encounter (HOSPITAL_BASED_OUTPATIENT_CLINIC_OR_DEPARTMENT_OTHER): Payer: Self-pay | Admitting: Urology

## 2022-12-10 ENCOUNTER — Other Ambulatory Visit: Payer: Self-pay

## 2022-12-10 ENCOUNTER — Emergency Department (HOSPITAL_BASED_OUTPATIENT_CLINIC_OR_DEPARTMENT_OTHER): Payer: Medicaid Other

## 2022-12-10 DIAGNOSIS — R072 Precordial pain: Secondary | ICD-10-CM

## 2022-12-10 DIAGNOSIS — R079 Chest pain, unspecified: Secondary | ICD-10-CM | POA: Diagnosis present

## 2022-12-10 LAB — BASIC METABOLIC PANEL
Anion gap: 7 (ref 5–15)
BUN: 15 mg/dL (ref 6–20)
CO2: 25 mmol/L (ref 22–32)
Calcium: 8.8 mg/dL — ABNORMAL LOW (ref 8.9–10.3)
Chloride: 105 mmol/L (ref 98–111)
Creatinine, Ser: 0.94 mg/dL (ref 0.44–1.00)
GFR, Estimated: >60 mL/min (ref >60)
Glucose, Bld: 85 mg/dL (ref 70–99)
Potassium: 3.5 mmol/L (ref 3.5–5.1)
Sodium: 137 mmol/L (ref 135–145)

## 2022-12-10 LAB — CBC
HCT: 40.1 % (ref 36.0–46.0)
Hemoglobin: 13.8 g/dL (ref 12.0–15.0)
MCH: 31.7 pg (ref 26.0–34.0)
MCHC: 34.4 g/dL (ref 30.0–36.0)
MCV: 92 fL (ref 80.0–100.0)
Platelets: 215 10*3/uL (ref 150–400)
RBC: 4.36 MIL/uL (ref 3.87–5.11)
RDW: 12.6 % (ref 11.5–15.5)
WBC: 5 10*3/uL (ref 4.0–10.5)
nRBC: 0 % (ref 0.0–0.2)

## 2022-12-10 LAB — TROPONIN I (HIGH SENSITIVITY): Troponin I (High Sensitivity): <2 ng/L (ref <18)

## 2022-12-10 NOTE — Discharge Instructions (Signed)
You were seen in the emergency department today for chest pain.  As we discussed your lab work, EKG, chest x-ray all looked reassuring today.   I recommend monitoring your stress levels.  Continue to monitor how you are doing overall, and return to the emergency department for any new or worsening symptoms such as: Worsening pain or pain with exertion, difficulty breathing, sweating, or pain or swelling in your legs.  Additionally, recommend close follow-up with your primary care doctor for continued evaluation and management of your symptoms.

## 2022-12-10 NOTE — ED Provider Notes (Signed)
[place] [facility] AT [facility] Provider Note   CSN: [id number] Arrival date & time: 12/10/22  1118     History  Chief Complaint  Patient presents with   Chest Pain    Ariel Flores is a 47 y.o. female.  Patient with no pertinent past medical history presents today with complaints of chest pain.  She states that same was present when she woke up this morning, it is left-sided in nature and feels like a "pulling sensation".  Pain does not radiate, is not pleuritic in nature, and is not reproducible to palpation.  She has felt this pain before, however yesterday she was watching on the news and saw someone recommending people with chest pain to go to the emergency department to get checked out. She presents today for same. She has never been evaluated for this before. Nothing makes the pain better or worse. She denies any cardiac history . Denies fevers, chills, shortness of breath, nausea, vomiting, or abdominal pain. Denies recent travel or recent surgeries, history of blood clots, history of malignancy, or OCP use. Upon my assessment, patient states that her pain does feel like it is improving. She denies cough or congestion. She does not smoke and denies any recreational drug use.  The history is provided by the patient. No language interpreter was used.  Chest Pain      Home Medications Prior to Admission medications   Medication Sig Start Date End Date Taking? Authorizing Provider  acetaminophen (TYLENOL) 325 MG tablet Take 2 tablets (650 mg total) by mouth every 6 (six) hours as needed for up to 30 doses for mild pain or moderate pain. 09/09/22   Terald Sleeper, MD  cetirizine (ZYRTEC ALLERGY) 10 MG tablet Take 1 tablet (10 mg total) by mouth at bedtime. 11/05/22 05/04/23  Theadora Rama Scales, PA-C  fluticasone (FLONASE) 50 MCG/ACT nasal spray Place 1 spray into both nostrils daily. Begin by using 2 sprays in each nare daily for 3 to 5 days, then  decrease to 1 spray in each nare daily. 11/05/22   Theadora Rama Scales, PA-C      Allergies    Patient has no known allergies.    Review of Systems   Review of Systems  Cardiovascular:  Positive for chest pain.  All other systems reviewed and are negative.   Physical Exam Updated Vital Signs BP 119/77   Pulse (!) 56   Temp 97.7 F (36.5 C) (Oral)   Resp 18   Ht 5\' 7"  (1.702 m)   Wt 81.6 kg   SpO2 100%   BMI 28.18 kg/m  Physical Exam Vitals and nursing note reviewed.  Constitutional:      General: She is not in acute distress.    Appearance: Normal appearance. She is normal weight. She is not ill-appearing, toxic-appearing or diaphoretic.  HENT:     Head: Normocephalic and atraumatic.  Cardiovascular:     Rate and Rhythm: Normal rate and regular rhythm.     Pulses:          Dorsalis pedis pulses are 2+ on the right side and 2+ on the left side.       Posterior tibial pulses are 2+ on the right side and 2+ on the left side.     Heart sounds: Normal heart sounds.  Pulmonary:     Effort: Pulmonary effort is normal. No respiratory distress.     Breath sounds: Normal breath sounds.  Chest:  Chest wall: No tenderness.  Abdominal:     Palpations: Abdomen is soft.     Tenderness: There is no abdominal tenderness.  Musculoskeletal:        General: Normal range of motion.     Cervical back: Normal range of motion.     Right lower leg: No tenderness. No edema.     Left lower leg: No tenderness. No edema.  Skin:    General: Skin is warm and dry.  Neurological:     General: No focal deficit present.     Mental Status: She is alert.  Psychiatric:        Mood and Affect: Mood normal.        Behavior: Behavior normal.     ED Results / Procedures / Treatments   Labs (all labs ordered are listed, but only abnormal results are displayed) Labs Reviewed  BASIC METABOLIC PANEL - Abnormal; Notable for the following components:      Result Value   Calcium 8.8 (*)     All other components within normal limits  CBC  PREGNANCY, URINE  TROPONIN I (HIGH SENSITIVITY)    EKG EKG Interpretation  Date/Time:  Friday December 10 2022 11:27:00 EDT Ventricular Rate:  65 PR Interval:  138 QRS Duration: 88 QT Interval:  396 QTC Calculation: 412 R Axis:   44 Text Interpretation: Sinus rhythm Minimal ST elevation, anterior leads no prior ECG for comparison. wandering baseline present. No STEMI Confirmed by Theda Belfast ([id number]) on 12/10/2022 12:46:38 PM  Radiology DG Chest 2 View  Result Date: 12/10/2022 CLINICAL DATA:  Chest pain EXAM: CHEST - 2 VIEW COMPARISON:  None Available. FINDINGS: No pleural effusion. No pneumothorax. Normal cardiac and mediastinal contours. There is a hazy opacity at the right lung base which could represent atelectasis or infection. No radiographically apparent displaced rib fractures. Visualized upper abdomen is unremarkable. Vertebral body heights are maintained. IMPRESSION: Hazy opacity at the right lung base could represent atelectasis or infection. Electronically Signed   By: Lorenza Cambridge M.D.   On: 12/10/2022 11:46    Procedures Procedures    Medications Ordered in ED Medications - No data to display  ED Course/ Medical Decision Making/ A&P             HEART Score: 1                Medical Decision Making Amount and/or Complexity of Data Reviewed Labs: ordered. Radiology: ordered.   This patient is a 47 y.o. female who presents to the ED for concern of chest pain, this involves an extensive number of treatment options, and is a complaint that carries with it a high risk of complications and morbidity. The emergent differential diagnosis prior to evaluation includes, but is not limited to,  ACS, pericarditis, myocarditis, aortic dissection, PE, pneumothorax, esophageal spasm or rupture, chronic angina, pneumonia, bronchitis, GERD, reflux/PUD, biliary disease, pancreatitis, costochondritis, anxiety  This is not an  exhaustive differential.   Past Medical History / Co-morbidities / Social History: N/A  Physical Exam: Physical exam performed. The pertinent findings include: no pertinent physical exam findings  Lab Tests: I ordered, and personally interpreted labs.  The pertinent results include:  no acute laboratory findings, troponin <2   Imaging Studies: I ordered imaging studies including CXR. I independently visualized and interpreted imaging which showed   Hazy opacity at the right lung base could represent atelectasis or infection.  I agree with the radiologist interpretation.   Patient denies any cough,  congestion, shortness of breath, fevers, or chills.  No concern for infection, likely atelectasis  Cardiac Monitoring:  The patient was maintained on a cardiac monitor.  My attending physician Dr. Rush Landmark viewed and interpreted the cardiac monitored which showed an underlying rhythm of: no STEMI. I agree with this interpretation.   Disposition:  Patient presents today with complaints of chest pain since this morning.  She is afebrile, nontoxic-appearing, and in no acute distress with reassuring vital signs.   After evaluating all of the data points in this case, the presentation of Ariel Flores is NOT consistent with Acute Coronary Syndrome (ACS) and/or myocardial ischemia, pulmonary embolism, aortic dissection; Ariel Flores, significant arrythmia, pneumothorax, cardiac tamponade, or other emergent cardiopulmonary condition.  Further, the presentation of Ariel Flores is NOT consistent with pericarditis, myocarditis, mediastinitis, endocarditis, new valvular disease.  Additionally, the presentation of Ariel Flores NOT consistent with flail chest, cardiac contusion, ARDS, or significant intra-thoracic bleeding.  Moreover, this presentation is NOT consistent with pneumonia or sepsis.  The patient has a HEART Score: 1 She is PERC negative.  Upon my assessment she states her pain is  improving. Evaluation and diagnostic testing in the emergency department does not suggest an emergent condition requiring admission or immediate intervention beyond what has been performed at this time.  Plan for discharge with close PCP follow-up.  Patient is understanding and amenable with plan, educated on red flag symptoms that would prompt immediate return.  Patient discharged in stable condition.   Strict return and follow-up precautions have been given by me personally or by detailed written instruction given verbally by nursing staff using the teach back method to the patient/family/caregiver(s).  Data Reviewed/Counseling: I have reviewed the patient's vital signs, nursing notes, and other relevant tests/information. I had a detailed discussion regarding the historical points, exam findings, and any diagnostic results supporting the discharge diagnosis. I also discussed the need for outpatient follow-up and the need to return to the ED if symptoms worsen or if there are any questions or concerns that arise at home.   Final Clinical Impression(s) / ED Diagnoses Final diagnoses:  Precordial chest pain    Rx / DC Orders ED Discharge Orders     None     An After Visit Summary was printed and given to the patient.     Vear Clock 12/10/22 1439    Tegeler, Canary Brim, MD 12/10/22 [phone]

## 2022-12-10 NOTE — ED Triage Notes (Signed)
Pt states woke up this am with generalized chest pain, denies N/V , denies SOB, denies any other related symptoms

## 2023-05-16 IMAGING — CT CT L SPINE W/O CM
3 series · 12 of 33 positions shown, 14 images · non-contrast
Comparison: None.

CLINICAL DATA: Low back pain



[Series 4: l spine soft · axial · 0.33mm/px · z∈[-164,+10]mm · 4 of 125 slices shown, 5 images]
[im 20/125  soft-tissue]
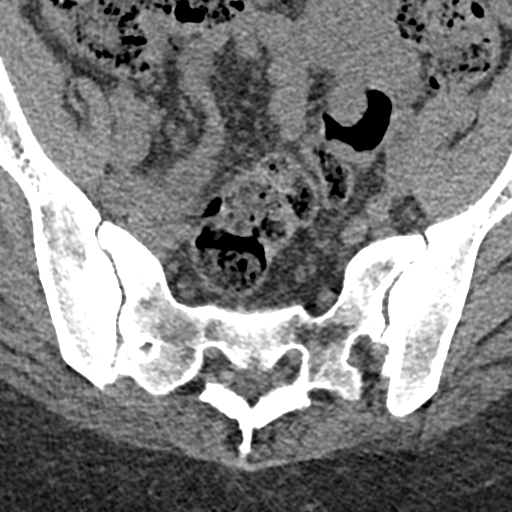
[im 20/125  bone]
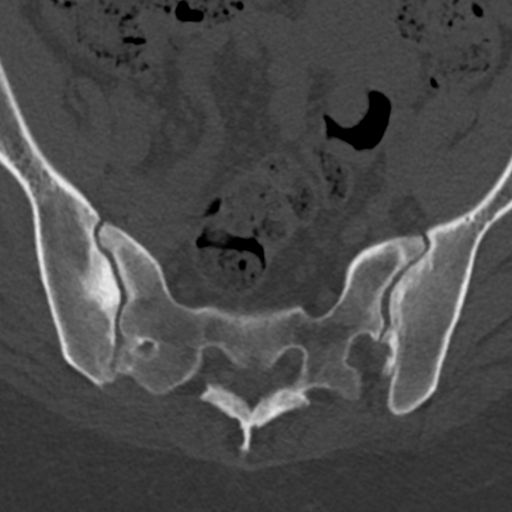
[im 48/125  bone]
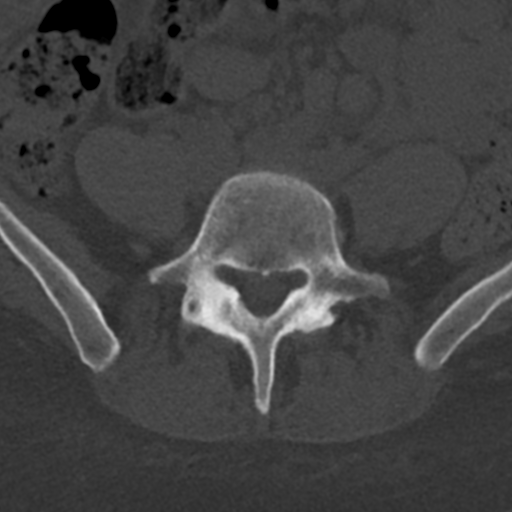
[im 77/125  bone]
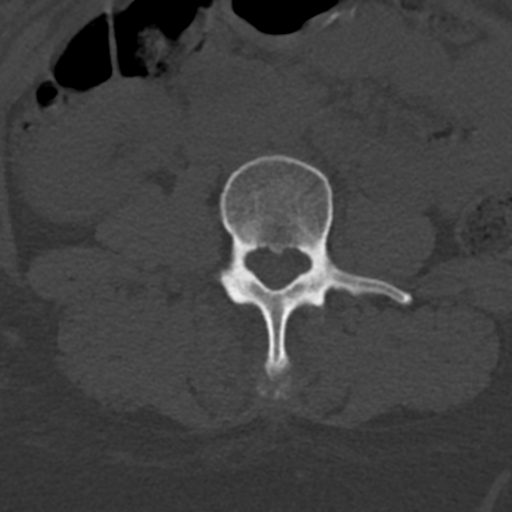
[im 105/125  bone]
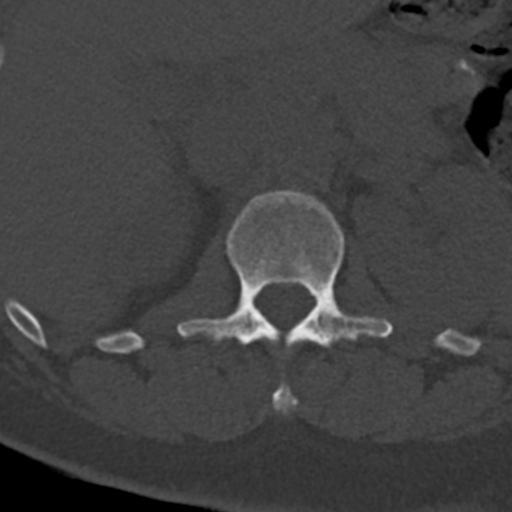

[Series 7: coronal bone · coronal · 0.37mm/px · 3 of 70 slices shown]
[im 14/70  bone]
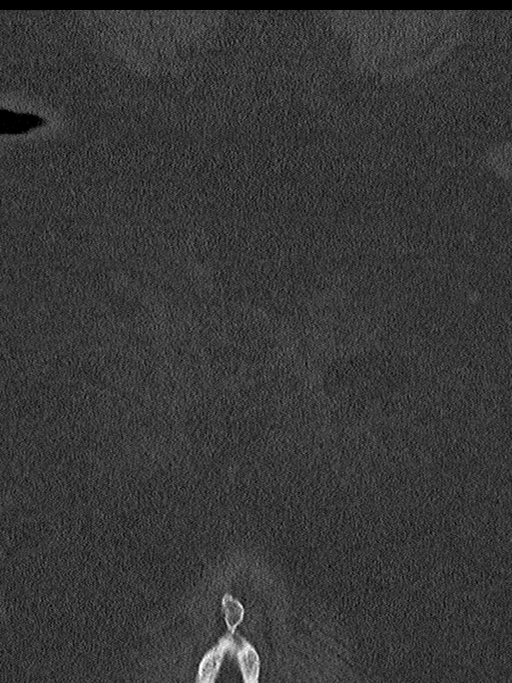
[im 28/70  bone]
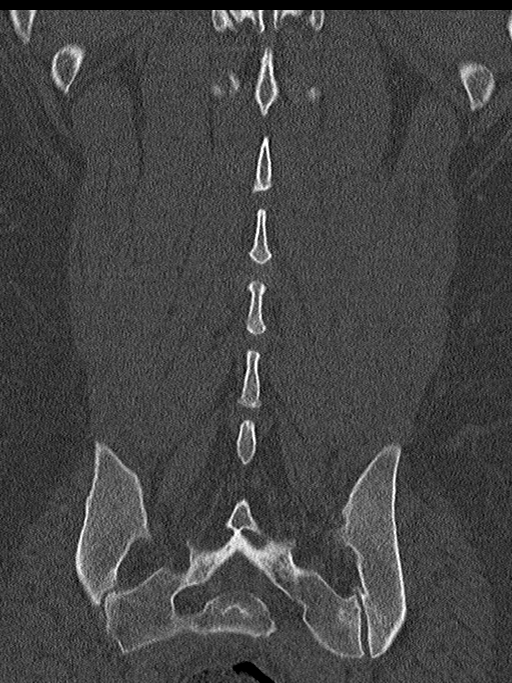
[im 42/70  bone]
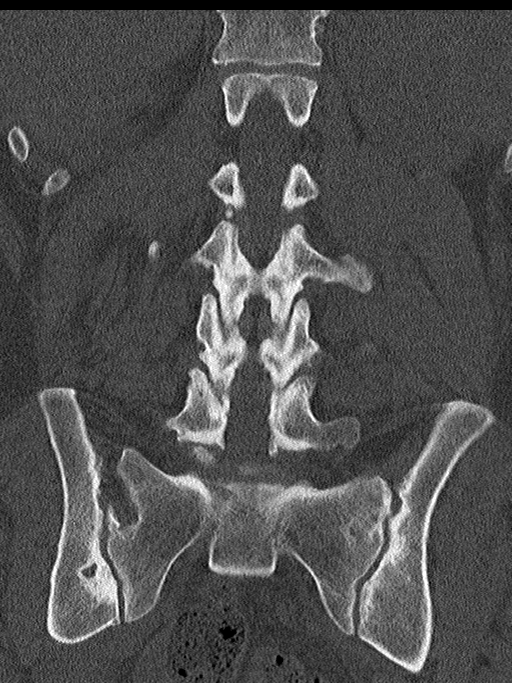

[Series 8: sagittal soft · sagittal · 0.34mm/px · 5 of 72 slices shown, 6 images]
[im 24/72  bone]
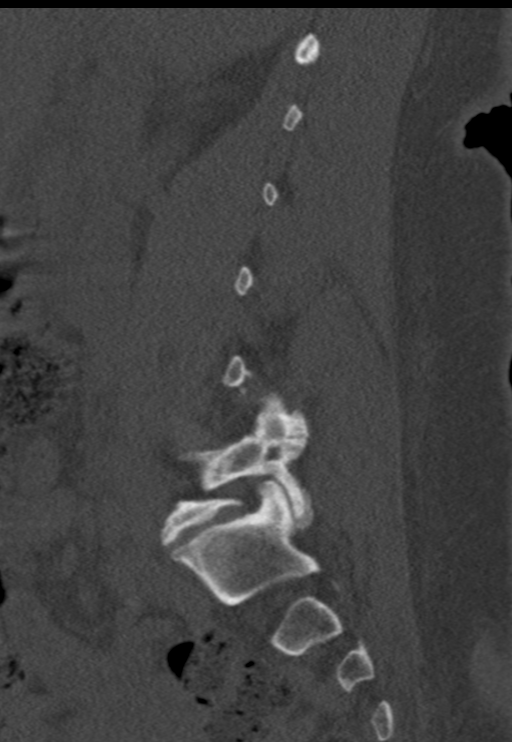
[im 30/72  bone]
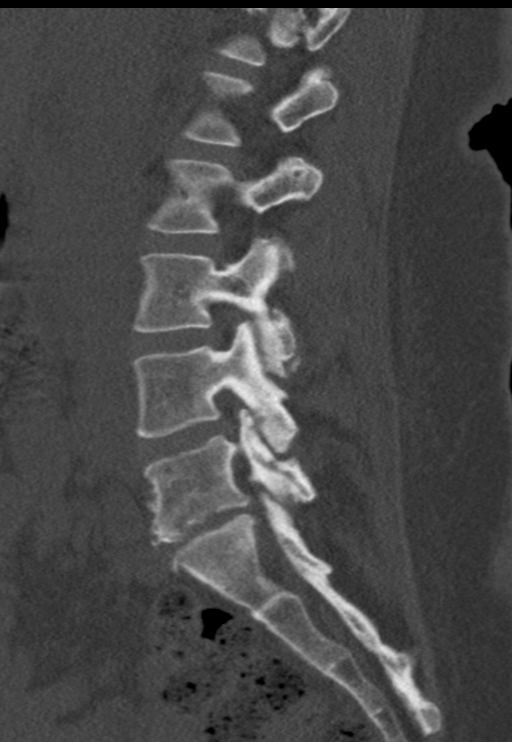
[im 36/72  soft-tissue]
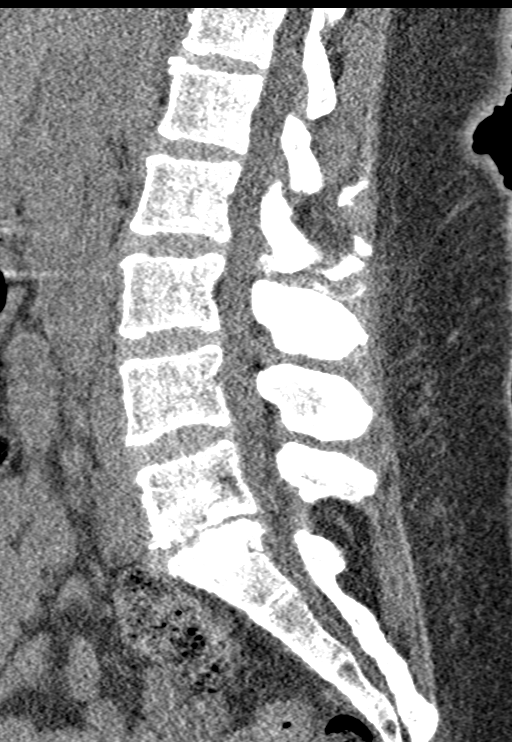
[im 36/72  bone]
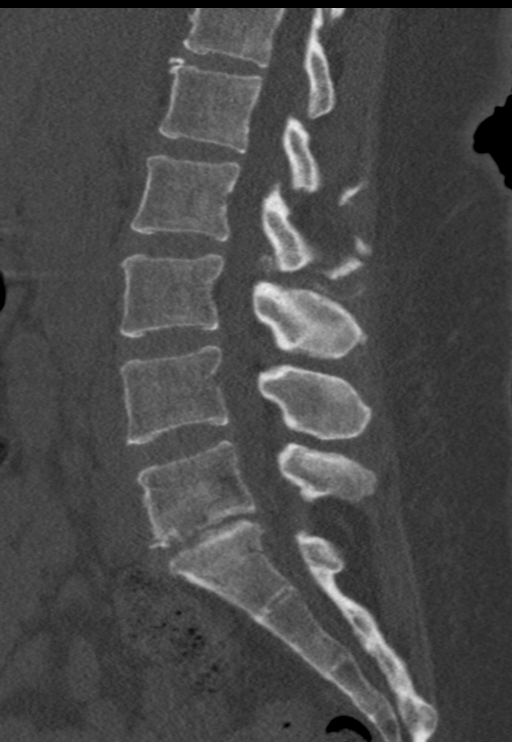
[im 42/72  bone]
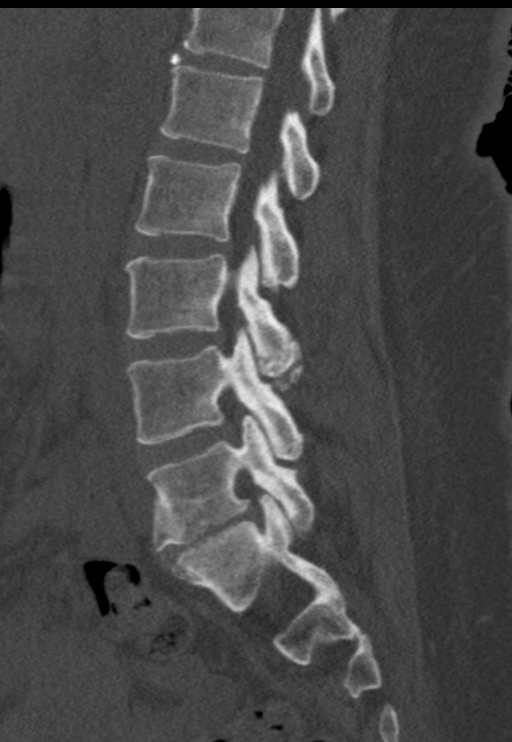
[im 48/72  bone]
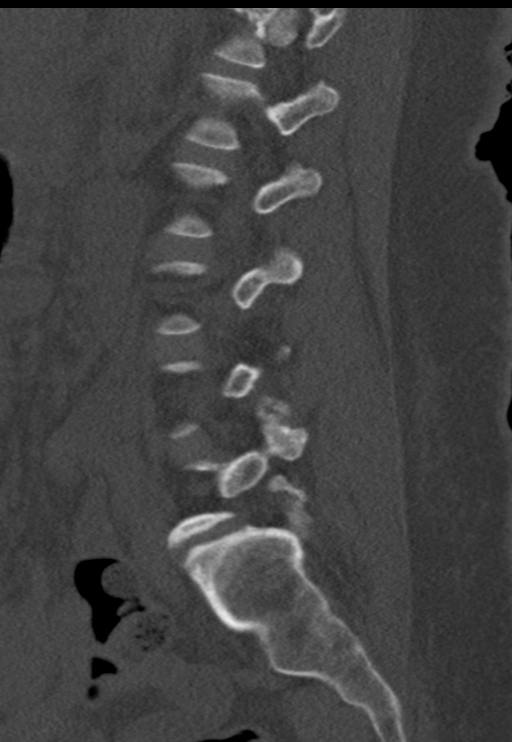

[12 of 33 positions shown; findings below may reference images not displayed]

FINDINGS: Segmentation: 5 lumbar type vertebrae.

Alignment: Normal.

Vertebrae: No acute fracture or focal pathologic process.

Paraspinal and other soft tissues: Negative. Hyperdense sludge and
small stones in the gallbladder.

Disc levels:

At L1-L2, maintained disc space. No canal stenosis. The foramen are
patent bilaterally.

At L2-L3, maintained disc space. No canal stenosis. Mild facet
degenerative changes. Foramen are patent bilaterally.

At L3-L4, maintained disc space. Mild diffuse disc bulge. No canal
stenosis. Moderate facet degenerative changes. Mild bilateral
foraminal narrowing.

At L4-L5, maintained disc space. Mild diffuse disc bulge. Moderate
hypertrophic facet degenerative changes. Ligamentum flavum
thickening. Mild canal stenosis.

At L5-S1, marked disc space narrowing. Mild diffuse disc bulge. Mild
canal stenosis. Hypertrophic facet degenerative changes. Moderate
right greater than left foraminal narrowing.
IMPRESSION: 1. No acute osseous abnormality.
2. Multilevel degenerative changes, most significant at L5-S1 where
there is bilateral foraminal encroachment by disc osteophyte. No
high-grade canal stenosis.

## 2024-01-11 ENCOUNTER — Encounter (HOSPITAL_BASED_OUTPATIENT_CLINIC_OR_DEPARTMENT_OTHER): Payer: Self-pay

## 2024-01-11 ENCOUNTER — Other Ambulatory Visit: Payer: Self-pay

## 2024-01-11 ENCOUNTER — Emergency Department (HOSPITAL_BASED_OUTPATIENT_CLINIC_OR_DEPARTMENT_OTHER)
Admission: EM | Admit: 2024-01-11 | Discharge: 2024-01-11 | Disposition: A | Discharge status: Home / Self Care | Attending: Emergency Medicine | Admitting: Emergency Medicine

## 2024-01-11 DIAGNOSIS — X58XXXA Exposure to other specified factors, initial encounter: Secondary | ICD-10-CM | POA: Diagnosis not present

## 2024-01-11 DIAGNOSIS — S3992XA Unspecified injury of lower back, initial encounter: Secondary | ICD-10-CM | POA: Diagnosis present

## 2024-01-11 DIAGNOSIS — S39012A Strain of muscle, fascia and tendon of lower back, initial encounter: Secondary | ICD-10-CM | POA: Insufficient documentation

## 2024-01-11 MED ORDER — METAXALONE 800 MG PO TABS: 800.0000 mg | ORAL_TABLET | Freq: Three times a day (TID) | ORAL | 0 refills | Status: AC

## 2024-01-11 MED ORDER — LIDOCAINE 5 % EX PTCH: 1.0000 | MEDICATED_PATCH | CUTANEOUS | 0 refills | Status: AC

## 2024-01-11 MED ORDER — LIDOCAINE 5 % EX PTCH
2.0000 | MEDICATED_PATCH | CUTANEOUS | Status: DC
  Administered 2024-01-11: 2 via TRANSDERMAL
  Filled 2024-01-11: qty 2

## 2024-01-11 MED ORDER — KETOROLAC TROMETHAMINE 15 MG/ML IJ SOLN
15.0000 mg | Freq: Once | INTRAMUSCULAR | Status: AC
  Administered 2024-01-11: 15 mg via INTRAMUSCULAR
  Filled 2024-01-11: qty 1
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
## 2024-01-11 NOTE — Discharge Instructions (Addendum)
 Today you were seen for pain.  Please pick up your medications and take as prescribed.  You may also alternate taking Tylenol  and Motrin as needed for pain.  Please follow-up with orthopedics if your symptoms persist for further evaluation and workup.  Please return to the ED if you have loss of control of your bowel or bladder, numbness, tingling, or fever that does not reduce with Tylenol  Motrin.  Thank you for letting us  treat you today. After reviewing your labs and imaging, I feel you are safe to go home. Please follow up with your PCP in the next several days and provide them with your records from this visit. Return to the Emergency Room if pain becomes severe or symptoms worsen.
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
## 2024-05-07 ENCOUNTER — Emergency Department (HOSPITAL_COMMUNITY)
Admission: EM | Admit: 2024-05-07 | Discharge: 2024-05-08 | Disposition: A | Discharge status: Home / Self Care | Attending: Emergency Medicine | Admitting: Emergency Medicine

## 2024-05-07 DIAGNOSIS — K029 Dental caries, unspecified: Secondary | ICD-10-CM | POA: Insufficient documentation

## 2024-05-07 DIAGNOSIS — K0889 Other specified disorders of teeth and supporting structures: Secondary | ICD-10-CM | POA: Diagnosis present

## 2024-05-07 MED ORDER — IBUPROFEN 800 MG PO TABS
800.0000 mg | ORAL_TABLET | Freq: Once | ORAL | Status: AC
  Administered 2024-05-07: 800 mg via ORAL
  Filled 2024-05-07: qty 1

## 2024-05-07 MED ORDER — LIDOCAINE VISCOUS HCL 2 % MT SOLN
15.0000 mL | Freq: Once | OROMUCOSAL | Status: AC
  Administered 2024-05-07: 15 mL via OROMUCOSAL
  Filled 2024-05-07: qty 15

## 2024-05-07 MED ORDER — PENICILLIN V POTASSIUM 500 MG PO TABS
500.0000 mg | ORAL_TABLET | Freq: Once | ORAL | Status: AC
  Administered 2024-05-07: 500 mg via ORAL
  Filled 2024-05-07: qty 1

## 2024-05-07 NOTE — ED Triage Notes (Signed)
 Patient complains of a tooth ache to her right lower side of mouth that started this morning. States pain is getting worse and Tylenol  has been ineffective. Patient denies any other symptoms.

## 2024-05-08 MED ORDER — NAPROXEN 500 MG PO TABS: 500.0000 mg | ORAL_TABLET | Freq: Two times a day (BID) | ORAL | 0 refills | Status: AC

## 2024-05-08 MED ORDER — PENICILLIN V POTASSIUM 500 MG PO TABS: 500.0000 mg | ORAL_TABLET | Freq: Four times a day (QID) | ORAL | 0 refills | Status: AC

## 2024-05-08 NOTE — ED Provider Notes (Signed)
 [place] EM[facility] DEPARTMENT AT [facility] Provider Note   CSN: [id number] Arrival date & time: 05/07/24  2134     Patient presents with: No chief complaint on file.   Ariel Flores is a 48 y.o. female.   The history is provided by the patient.  Dental Pain Location:  Lower Lower teeth location:  31/RL 2nd molar Quality:  Aching Severity:  Severe Onset quality:  Gradual Timing:  Constant Progression:  Unchanged Chronicity:  New Context: dental caries   Previous work-up:  Dental exam Relieved by:  Nothing Worsened by:  Nothing Ineffective treatments:  None tried Associated symptoms: no congestion, no facial swelling and no fever   Risk factors: no alcohol problem        Prior to Admission medications   Medication Sig Start Date End Date Taking? Authorizing Provider  naproxen  (NAPROSYN ) 500 MG tablet Take 1 tablet (500 mg total) by mouth 2 (two) times daily with a meal. 05/08/24  Yes Alfonso Carden, MD  penicillin  v potassium (VEETID) 500 MG tablet Take 1 tablet (500 mg total) by mouth 4 (four) times daily for 7 days. 05/08/24 05/15/24 Yes Hilliard Borges, MD  acetaminophen  (TYLENOL ) 325 MG tablet Take 2 tablets (650 mg total) by mouth every 6 (six) hours as needed for up to 30 doses for mild pain or moderate pain. 09/09/22   Cottie Donnice PARAS, MD  cetirizine  (ZYRTEC  ALLERGY) 10 MG tablet Take 1 tablet (10 mg total) by mouth at bedtime. 11/05/22 05/04/23  Joesph Shaver Scales, PA-C  fluticasone  (FLONASE ) 50 MCG/ACT nasal spray Place 1 spray into both nostrils daily. Begin by using 2 sprays in each nare daily for 3 to 5 days, then decrease to 1 spray in each nare daily. 11/05/22   Joesph Shaver Scales, PA-C  lidocaine  (LIDODERM ) 5 % Place 1 patch onto the skin daily. Remove & Discard patch within 12 hours or as directed by MD 01/11/24   Keith, Kayla N, PA-C  metaxalone  (SKELAXIN ) 800 MG tablet Take 1 tablet (800 mg total) by mouth 3 (three) times daily. 01/11/24    Keith, Kayla N, PA-C    Allergies: Patient has no known allergies.    Review of Systems  Constitutional:  Negative for fever.  HENT:  Positive for dental problem. Negative for congestion and facial swelling.   All other systems reviewed and are negative.   Updated Vital Signs BP 132/71 (BP Location: Left Arm)   Pulse (!) 53   Temp 98.2 F (36.8 C) (Oral)   Resp 16   SpO2 100%   Physical Exam Vitals and nursing note reviewed.  Constitutional:      General: She is not in acute distress.    Appearance: Normal appearance. She is well-developed.  HENT:     Head: Normocephalic and atraumatic.     Nose: Nose normal.     Mouth/Throat:     Comments: Dental caries  Eyes:     Pupils: Pupils are equal, round, and reactive to light.  Cardiovascular:     Rate and Rhythm: Normal rate and regular rhythm.     Pulses: Normal pulses.     Heart sounds: Normal heart sounds.  Pulmonary:     Effort: Pulmonary effort is normal. No respiratory distress.     Breath sounds: Normal breath sounds.  Abdominal:     General: Bowel sounds are normal. There is no distension.     Palpations: Abdomen is soft.     Tenderness: There is no abdominal  tenderness. There is no guarding or rebound.  Musculoskeletal:        General: Normal range of motion.     Cervical back: Normal range of motion and neck supple.  Skin:    General: Skin is warm and dry.     Capillary Refill: Capillary refill takes less than 2 seconds.     Findings: No erythema or rash.  Neurological:     General: No focal deficit present.     Mental Status: She is alert and oriented to person, place, and time.     Deep Tendon Reflexes: Reflexes normal.  Psychiatric:        Mood and Affect: Mood normal.     (all labs ordered are listed, but only abnormal results are displayed) Labs Reviewed - No data to display  EKG: None  Radiology: No results found.   Procedures   Medications Ordered in the ED  penicillin  v potassium  (VEETID) tablet 500 mg (500 mg Oral Given 05/07/24 2347)  ibuprofen  (ADVIL ) tablet 800 mg (800 mg Oral Given 05/07/24 2347)  lidocaine  (XYLOCAINE ) 2 % viscous mouth solution 15 mL (15 mLs Mouth/Throat Given 05/07/24 2347)                                    Medical Decision Making Patient with dental pain  Amount and/or Complexity of Data Reviewed External Data Reviewed: notes.    Details: Previous notes reviewed   Risk Prescription drug management. Risk Details: Well appearing, dental caries will start PCN as patient is not allergic and NSAIDs.  Dental resource guide provided.  Stable for discharge with close follow up with dentistry for definitive care.  Strict returns      Final diagnoses:  Dental caries   no signs of systemic illness or infection. The patient is nontoxic-appearing on exam and vital signs are within normal limits.  I have reviewed the triage vital signs and the nursing notes. Pertinent labs & imaging results that were available during my care of the patient were reviewed by me and considered in my medical decision making (see chart for details). After history, exam, and medical workup I feel the patient has been appropriately medically screened and is safe for discharge home. Pertinent diagnoses were discussed with the patient. Patient was given return precautions.  ED Discharge Orders          Ordered    penicillin  v potassium (VEETID) 500 MG tablet  4 times daily        05/08/24 0011    naproxen  (NAPROSYN ) 500 MG tablet  2 times daily with meals        05/08/24 0012               Hyatt Capobianco, MD 05/08/24 0020
